# Patient Record
Sex: Female | Born: 1975 | Race: White | Hispanic: No | Marital: Married | Smoking: Never smoker
Health system: Southern US, Community
[De-identification: ages and names within clinical notes are randomized; demographics above are authoritative.]

---

## 2017-01-17 ENCOUNTER — Other Ambulatory Visit (HOSPITAL_COMMUNITY): Payer: Self-pay | Admitting: Internal Medicine

## 2017-01-17 DIAGNOSIS — R1030 Lower abdominal pain, unspecified: Secondary | ICD-10-CM

## 2017-01-17 DIAGNOSIS — R109 Unspecified abdominal pain: Secondary | ICD-10-CM

## 2017-01-23 ENCOUNTER — Ambulatory Visit (HOSPITAL_COMMUNITY)
Admission: RE | Admit: 2017-01-23 | Discharge: 2017-01-23 | Disposition: A | Discharge status: Home / Self Care | Payer: 59 | Source: Ambulatory Visit | Attending: Internal Medicine | Admitting: Internal Medicine

## 2017-01-30 ENCOUNTER — Ambulatory Visit (HOSPITAL_COMMUNITY)
Admission: RE | Admit: 2017-01-30 | Discharge: 2017-01-30 | Disposition: A | Discharge status: Home / Self Care | Payer: 59 | Source: Ambulatory Visit | Attending: Internal Medicine | Admitting: Internal Medicine

## 2017-01-30 ENCOUNTER — Encounter (HOSPITAL_COMMUNITY): Payer: Self-pay

## 2018-07-17 ENCOUNTER — Encounter (HOSPITAL_COMMUNITY): Payer: Self-pay | Admitting: Emergency Medicine

## 2018-07-17 ENCOUNTER — Emergency Department (HOSPITAL_COMMUNITY): Payer: 59

## 2018-07-17 ENCOUNTER — Emergency Department (HOSPITAL_COMMUNITY)
Admission: EM | Admit: 2018-07-17 | Discharge: 2018-07-17 | Disposition: A | Discharge status: Home / Self Care | Payer: 59 | Attending: Emergency Medicine | Admitting: Emergency Medicine

## 2018-07-17 DIAGNOSIS — Z79899 Other long term (current) drug therapy: Secondary | ICD-10-CM | POA: Insufficient documentation

## 2018-07-17 DIAGNOSIS — R1032 Left lower quadrant pain: Secondary | ICD-10-CM | POA: Insufficient documentation

## 2018-07-17 LAB — URINALYSIS, ROUTINE W REFLEX MICROSCOPIC
Bacteria, UA: NONE SEEN
Bilirubin Urine: NEGATIVE
Glucose, UA: NEGATIVE mg/dL
Ketones, ur: NEGATIVE mg/dL
Leukocytes, UA: NEGATIVE
Nitrite: NEGATIVE
Protein, ur: NEGATIVE mg/dL
Specific Gravity, Urine: 1.014 (ref 1.005–1.030)
pH: 5 (ref 5.0–8.0)

## 2018-07-17 LAB — COMPREHENSIVE METABOLIC PANEL WITH GFR
ALT: 19 U/L (ref 0–44)
AST: 22 U/L (ref 15–41)
Albumin: 3.8 g/dL (ref 3.5–5.0)
Alkaline Phosphatase: 52 U/L (ref 38–126)
Anion gap: 9 (ref 5–15)
BUN: 10 mg/dL (ref 6–20)
CO2: 24 mmol/L (ref 22–32)
Calcium: 9.2 mg/dL (ref 8.9–10.3)
Chloride: 108 mmol/L (ref 98–111)
Creatinine, Ser: 0.82 mg/dL (ref 0.44–1.00)
GFR calc Af Amer: >60 mL/min
GFR calc non Af Amer: >60 mL/min
Glucose, Bld: 98 mg/dL (ref 70–99)
Potassium: 5.2 mmol/L — ABNORMAL HIGH (ref 3.5–5.1)
Sodium: 141 mmol/L (ref 135–145)
Total Bilirubin: 0.8 mg/dL (ref 0.3–1.2)
Total Protein: 7 g/dL (ref 6.5–8.1)

## 2018-07-17 LAB — CBC
HEMATOCRIT: 40.3 % (ref 36.0–46.0)
HEMOGLOBIN: 13.2 g/dL (ref 12.0–15.0)
MCH: 31.5 pg (ref 26.0–34.0)
MCHC: 32.8 g/dL (ref 30.0–36.0)
MCV: 96.2 fL (ref 78.0–100.0)
Platelets: 268 10*3/uL (ref 150–400)
RBC: 4.19 MIL/uL (ref 3.87–5.11)
RDW: 11.9 % (ref 11.5–15.5)
WBC: 7.9 10*3/uL (ref 4.0–10.5)

## 2018-07-17 LAB — I-STAT BETA HCG BLOOD, ED (MC, WL, AP ONLY): I-stat hCG, quantitative: <5 m[IU]/mL

## 2018-07-17 LAB — LIPASE, BLOOD: Lipase: 31 U/L (ref 11–51)

## 2018-07-17 MED ORDER — ONDANSETRON 4 MG PO TBDP: 4.0000 mg | ORAL_TABLET | Freq: Three times a day (TID) | ORAL | 0 refills | Status: AC | PRN

## 2018-07-17 MED ORDER — ONDANSETRON 4 MG PO TBDP
4.0000 mg | ORAL_TABLET | Freq: Once | ORAL | Status: AC
  Administered 2018-07-17: 4 mg via ORAL
  Filled 2018-07-17: qty 1

## 2018-07-17 MED ORDER — OXYCODONE-ACETAMINOPHEN 5-325 MG PO TABS
1.0000 | ORAL_TABLET | ORAL | Status: DC | PRN
  Administered 2018-07-17: 1 via ORAL
  Filled 2018-07-17: qty 1

## 2018-07-17 MED ORDER — OXYCODONE-ACETAMINOPHEN 5-325 MG PO TABS: 2.0000 | ORAL_TABLET | Freq: Four times a day (QID) | ORAL | 0 refills | Status: AC | PRN

## 2018-07-17 NOTE — Discharge Instructions (Addendum)
Evaluated today for abdominal pain.  Ultrasound results did not show any evidence of ovarian torsion.  However as discussed during your visit ultrasounds do not show everything in the abdomen.  We had discussed a possible CT scan however you have wished to not proceed with that.    Please follow-up with your PCP or return to the ED for any of the following symptoms. Your pain does not go away as soon as your doctor says it should. You cannot stop throwing up. Your pain is only in areas of your belly, such as the right side or the left lower part of the belly. You have bloody or black poop, or poop that looks like tar. You have very bad pain, cramping, or bloating in your belly. You have signs of not having enough fluid or water in your body (dehydration), such as: Dark pee, very little pee, or no pee. Cracked lips. Dry mouth. Sunken eyes. Sleepiness. Weakness.

## 2018-07-17 NOTE — ED Triage Notes (Signed)
Pt to ER for evaluation of LLQ abdominal pain onset yesterday at 9:30 am without any radiation. States associated nausea, without vomiting. Reports went to PCP this morning and was sent here for rule out ovarian torsion. Pt in NAD at this moment. Pt reports urination hurts.

## 2018-07-17 NOTE — ED Provider Notes (Signed)
[facility] EMERGENCY DEPARTMENT Provider Note   CSN: [id number] Arrival date & time: 07/17/18  1113   History   Chief Complaint Chief Complaint  Patient presents with  . Abdominal Pain    HPI Colleen Davidson is a 42 y.o. female with no significant medical history who presents for evaluation of LLQ pain. Was seen at primary care and sent for evaluation of ovarian torsion. States her pain began yesterday and has been intermittent in nature. Rates her pain a 9/10. Denies radiation of pain. Pain worse with movement. Rest alleviates the pain. Admits to nausea. Denies emesis, fever, chills, chest pain, SOB, diarrhea, constipation, dysuria, vaginal discharge, hx of STI. Currently on cycle. Does not want pelvic exam.  HPI  History reviewed. No pertinent past medical history.  There are no active problems to display for this patient.   History reviewed. No pertinent surgical history.   OB History   None      Home Medications    Prior to Admission medications   Medication Sig Start Date End Date Taking? Authorizing Provider  albuterol (PROVENTIL HFA;VENTOLIN HFA) 108 (90 Base) MCG/ACT inhaler Inhale 2 puffs into the lungs every 6 (six) hours as needed for wheezing.   Yes [provider]  cetirizine (ZYRTEC) 10 MG tablet Take 10 mg by mouth daily. 06/07/18  Yes [provider]  montelukast (SINGULAIR) 10 MG tablet Take 10 mg by mouth daily. 06/11/18  Yes [provider]  SYMBICORT 160-4.5 MCG/ACT inhaler Inhale 2 puffs into the lungs 2 (two) times daily. 07/09/18  Yes [provider]  ondansetron (ZOFRAN ODT) 4 MG disintegrating tablet Take 1 tablet (4 mg total) by mouth every 8 (eight) hours as needed for nausea or vomiting. 07/17/18   Chrishawna Farina A, PA-C  oxyCODONE-acetaminophen (PERCOCET/ROXICET) 5-325 MG tablet Take 2 tablets by mouth every 6 (six) hours as needed for up to 3 days for severe pain. 07/17/18 07/20/18  Taela Charbonneau  A, PA-C    Family History History reviewed. No pertinent family history.  Social History Social History   Tobacco Use  . Smoking status: Never Smoker  . Smokeless tobacco: Never Used  Substance Use Topics  . Alcohol use: Not Currently  . Drug use: Not on file     Allergies   Morphine and related   Review of Systems Review of Systems  Review of symptoms negative unless otherwise stated in the HPI. Physical Exam Updated Vital Signs BP 111/70 (BP Location: Right Arm)   Pulse 64   Temp 98.5 F (36.9 C) (Oral)   Resp 18   LMP 07/14/2018   SpO2 100%   Physical Exam  Constitutional: She appears well-developed and well-nourished.  Non-toxic appearance. She does not appear ill. No distress.  HENT:  Head: Atraumatic.  Eyes: Pupils are equal, round, and reactive to light.  Neck: Normal range of motion. Neck supple.  Cardiovascular: Normal rate, regular rhythm, normal heart sounds and intact distal pulses.  Pulmonary/Chest: Effort normal. No respiratory distress.  Abdominal: Soft. Normal appearance, normal aorta and bowel sounds are normal. She exhibits no shifting dullness, no distension, no pulsatile liver, no fluid wave, no abdominal bruit, no ascites, no pulsatile midline mass and no mass. There is no hepatosplenomegaly. There is tenderness in the left lower quadrant. There is no rigidity, no rebound, no guarding, no CVA tenderness, no tenderness at McBurney's point and negative Murphy's sign. No hernia.  Musculoskeletal: Normal range of motion.  Neurological: She is alert.  Skin:  Skin is warm and dry. No rash noted. She is not diaphoretic. No cyanosis or erythema. No pallor.  Psychiatric: She has a normal mood and affect.  Nursing note and vitals reviewed.    ED Treatments / Results  Labs (all labs ordered are listed, but only abnormal results are displayed) Labs Reviewed  COMPREHENSIVE METABOLIC PANEL - Abnormal; Notable for the following components:      Result  Value   Potassium 5.2 (*)    All other components within normal limits  URINALYSIS, ROUTINE W REFLEX MICROSCOPIC - Abnormal; Notable for the following components:   Hgb urine dipstick LARGE (*)    All other components within normal limits  LIPASE, BLOOD  CBC  I-STAT BETA HCG BLOOD, ED ([facility], [facility], [facility] ONLY)    EKG None  Radiology US Transvaginal Non-ob  Result Date: 07/17/2018 CLINICAL DATA:  Left pelvic pain for 2 days. Clinical suspicion for ovarian torsion. EXAM: TRANSABDOMINAL AND TRANSVAGINAL ULTRASOUND OF PELVIS DOPPLER ULTRASOUND OF OVARIES TECHNIQUE: Both transabdominal and transvaginal ultrasound examinations of the pelvis were performed. Transabdominal technique was performed for global imaging of the pelvis including uterus, ovaries, adnexal regions, and pelvic cul-de-sac. It was necessary to proceed with endovaginal exam following the transabdominal exam to visualize the endometrium and ovaries. Color and duplex Doppler ultrasound was utilized to evaluate blood flow to the ovaries. COMPARISON:  None. FINDINGS: Uterus Measurements: 10.8 x 4.8 x 4.9 cm. No fibroids or other mass visualized. Endometrium Thickness: 8 mm.  No focal abnormality visualized. Right ovary Measurements: 3.9 x 2.0 x 2.1 cm. Normal appearance/no adnexal mass. Left ovary Measurements: 3.4 x 2.0 x 2.7 cm. Normal appearance/no adnexal mass. Pulsed Doppler evaluation of both ovaries demonstrates normal low-resistance arterial and venous waveforms. Other findings No abnormal free fluid. IMPRESSION: Negative. No pelvic mass or other significant abnormality identified. No sonographic evidence for ovarian torsion. Electronically Signed   By: Myles Rosenthal M.D.   On: 07/17/2018 13:06   US Pelvis Complete  Result Date: 07/17/2018 CLINICAL DATA:  Left pelvic pain for 2 days. Clinical suspicion for ovarian torsion. EXAM: TRANSABDOMINAL AND TRANSVAGINAL ULTRASOUND OF PELVIS DOPPLER ULTRASOUND OF OVARIES TECHNIQUE: Both transabdominal  and transvaginal ultrasound examinations of the pelvis were performed. Transabdominal technique was performed for global imaging of the pelvis including uterus, ovaries, adnexal regions, and pelvic cul-de-sac. It was necessary to proceed with endovaginal exam following the transabdominal exam to visualize the endometrium and ovaries. Color and duplex Doppler ultrasound was utilized to evaluate blood flow to the ovaries. COMPARISON:  None. FINDINGS: Uterus Measurements: 10.8 x 4.8 x 4.9 cm. No fibroids or other mass visualized. Endometrium Thickness: 8 mm.  No focal abnormality visualized. Right ovary Measurements: 3.9 x 2.0 x 2.1 cm. Normal appearance/no adnexal mass. Left ovary Measurements: 3.4 x 2.0 x 2.7 cm. Normal appearance/no adnexal mass. Pulsed Doppler evaluation of both ovaries demonstrates normal low-resistance arterial and venous waveforms. Other findings No abnormal free fluid. IMPRESSION: Negative. No pelvic mass or other significant abnormality identified. No sonographic evidence for ovarian torsion. Electronically Signed   By: Myles Rosenthal M.D.   On: 07/17/2018 13:06   [place] Art/ven Flow Abd Pelv Doppler  Result Date: 07/17/2018 CLINICAL DATA:  Left pelvic pain for 2 days. Clinical suspicion for ovarian torsion. EXAM: TRANSABDOMINAL AND TRANSVAGINAL ULTRASOUND OF PELVIS DOPPLER ULTRASOUND OF OVARIES TECHNIQUE: Both transabdominal and transvaginal ultrasound examinations of the pelvis were performed. Transabdominal technique was performed for global imaging of the pelvis including uterus, ovaries, adnexal regions, and pelvic  cul-de-sac. It was necessary to proceed with endovaginal exam following the transabdominal exam to visualize the endometrium and ovaries. Color and duplex Doppler ultrasound was utilized to evaluate blood flow to the ovaries. COMPARISON:  None. FINDINGS: Uterus Measurements: 10.8 x 4.8 x 4.9 cm. No fibroids or other mass visualized. Endometrium Thickness: 8 mm.  No focal  abnormality visualized. Right ovary Measurements: 3.9 x 2.0 x 2.1 cm. Normal appearance/no adnexal mass. Left ovary Measurements: 3.4 x 2.0 x 2.7 cm. Normal appearance/no adnexal mass. Pulsed Doppler evaluation of both ovaries demonstrates normal low-resistance arterial and venous waveforms. Other findings No abnormal free fluid. IMPRESSION: Negative. No pelvic mass or other significant abnormality identified. No sonographic evidence for ovarian torsion. Electronically Signed   By: Myles Rosenthal M.D.   On: 07/17/2018 13:06    Procedures Procedures (including critical care time)  Medications Ordered in ED Medications  oxyCODONE-acetaminophen (PERCOCET/ROXICET) 5-325 MG per tablet 1 tablet (1 tablet Oral Given 07/17/18 1151)  ondansetron (ZOFRAN-ODT) disintegrating tablet 4 mg (has no administration in time range)     Initial Impression / Assessment and Plan / ED Course  I have reviewed the triage vital signs and the nursing notes as well as past medical history.  Pertinent labs & imaging results that were available during my care of the patient were reviewed by me and considered in my medical decision making (see chart for details).  42 year old presents for evaluation of LLQ pain x 1 day. Seen at PCP and sent for r/o ovarian torsion. Patient is nontoxic, nonseptic appearing, in no apparent distress.  U/S negative for torsion. Serial abdominal exams benign. Patient's pain and other symptoms adequately managed in emergency department. Labs, imaging and vitals reviewed.  Patient does not meet the SIRS or Sepsis criteria.    On repeat exam patient does not have a surgical abdomin and there are no peritoneal signs.  No indication of appendicitis, bowel obstruction, bowel perforation, cholecystitis, diverticulitis, PID or ectopic pregnancy.   Discussed with patient possible CT scan to determine cause of her abdominal pain. Patient prefers to not have a CT scan at this time and to go home with pain  medication and follow up with PCP. Patient discharged home with symptomatic treatment and given strict instructions for follow-up with their primary care physician.  Her potassium is mildy elevated. Discussed to follow up with PCP for recheck. EKG: NSR, without  I have also discussed reasons to return immediately to the ER. Patient expresses understanding and agrees with plan.     Final Clinical Impressions(s) / ED Diagnoses   Final diagnoses:  Left lower quadrant pain    ED Discharge Orders         Ordered    oxyCODONE-acetaminophen (PERCOCET/ROXICET) 5-325 MG tablet  Every 6 hours PRN     07/17/18 1351    ondansetron (ZOFRAN ODT) 4 MG disintegrating tablet  Every 8 hours PRN     07/17/18 1402           Joyce Heitman A, PA-C 07/17/18 1405    Sabas Sous, MD 07/17/18 2251

## 2018-10-19 IMAGING — US US PELVIS COMPLETE
1 series · 13 of 25 positions shown · non-contrast
Comparison: None.

CLINICAL DATA: Left pelvic pain for 2 days. Clinical suspicion for
ovarian torsion.

EXAM:
TRANSABDOMINAL AND TRANSVAGINAL ULTRASOUND OF PELVIS
DOPPLER ULTRASOUND OF OVARIES
TECHNIQUE: Both transabdominal and transvaginal ultrasound examinations of the
pelvis were performed. Transabdominal technique was performed for
global imaging of the pelvis including uterus, ovaries, adnexal
regions, and pelvic cul-de-sac.
It was necessary to proceed with endovaginal exam following the
transabdominal exam to visualize the endometrium and ovaries. Color
and duplex Doppler ultrasound was utilized to evaluate blood flow to
the ovaries.

[Series 1: us pelvis complete · 0.21mm/px · 13 of 76 slices shown]
[im 1/76]
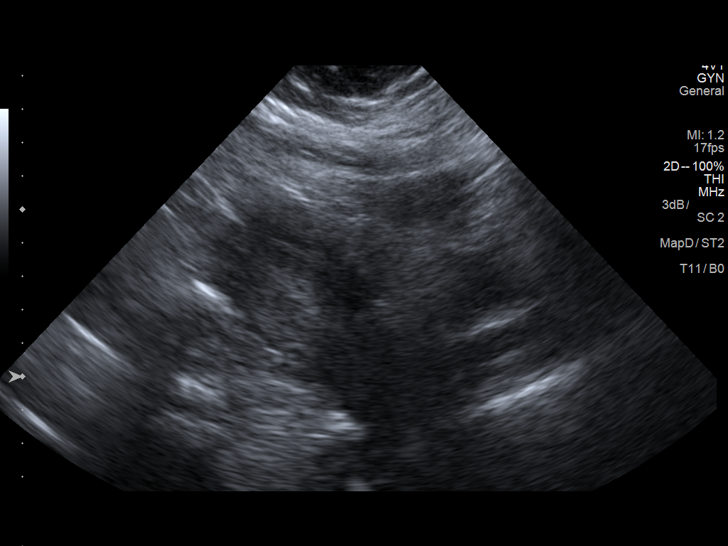
[im 7/76]
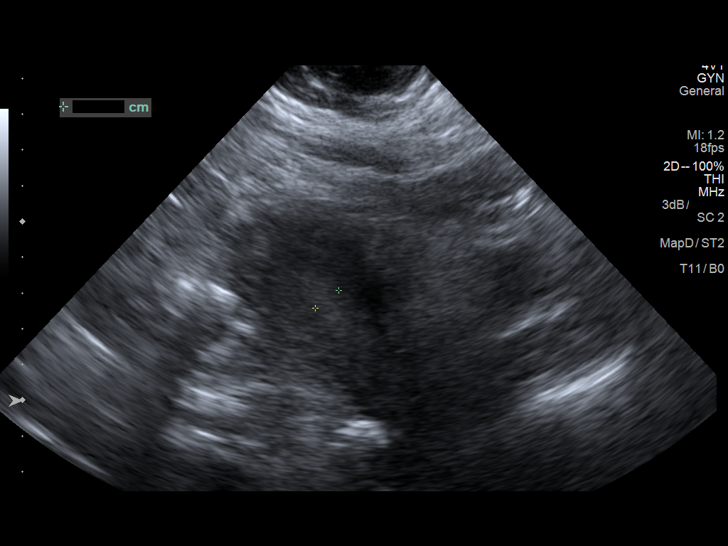
[im 13/76]
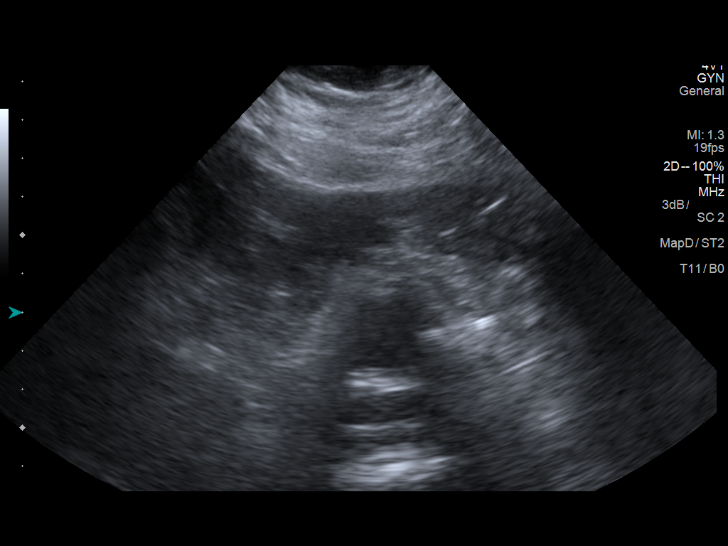
[im 19/76]
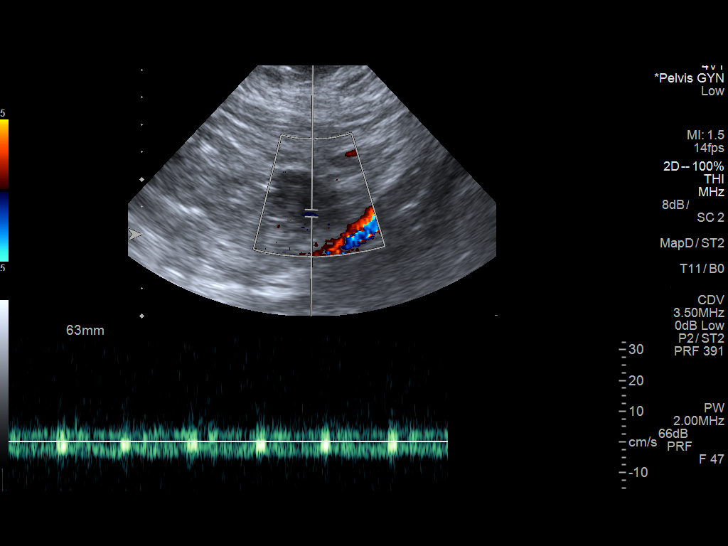
[im 26/76]
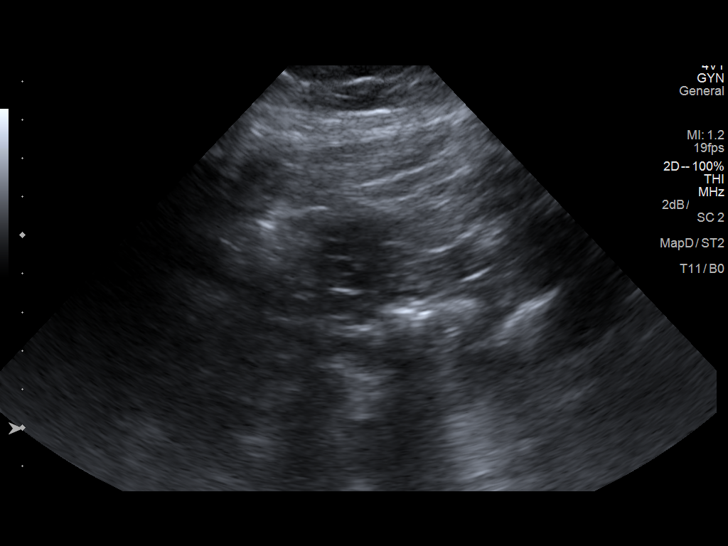
[im 32/76]
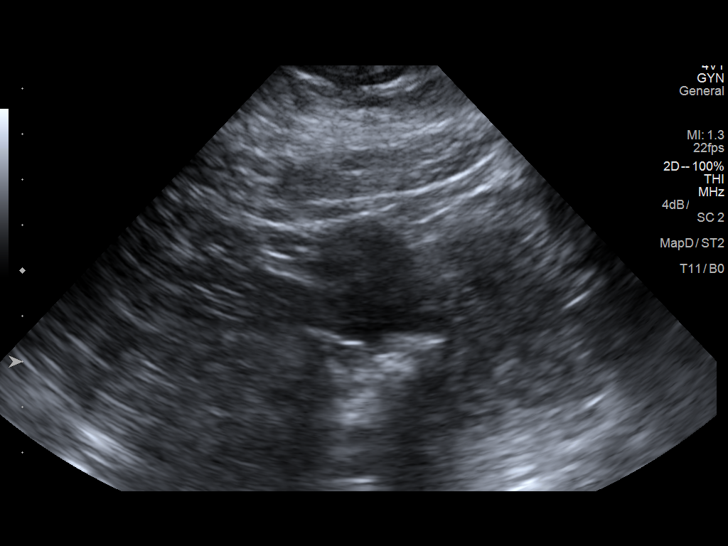
[im 38/76]
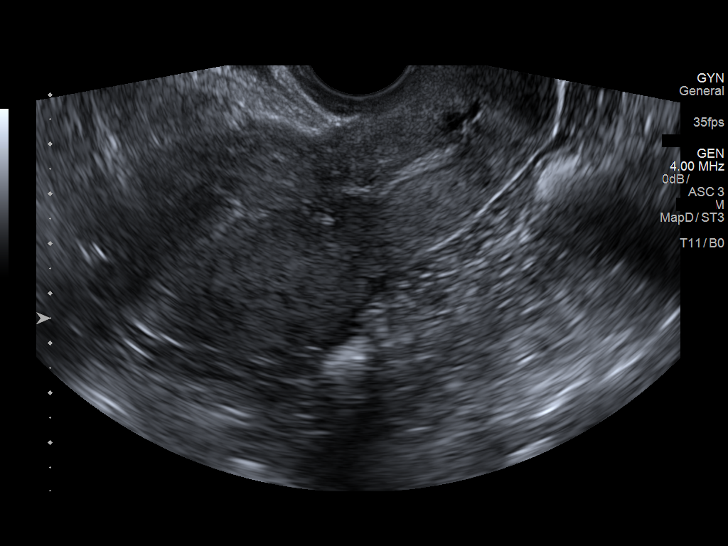
[im 44/76]
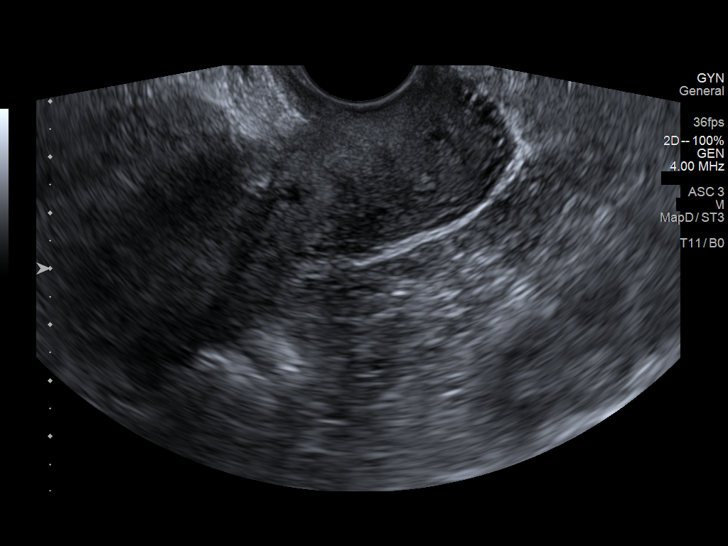
[im 51/76]
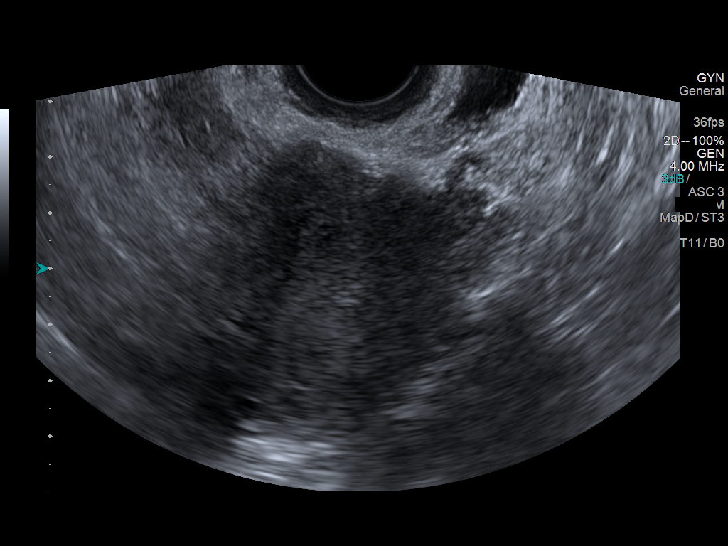
[im 57/76]
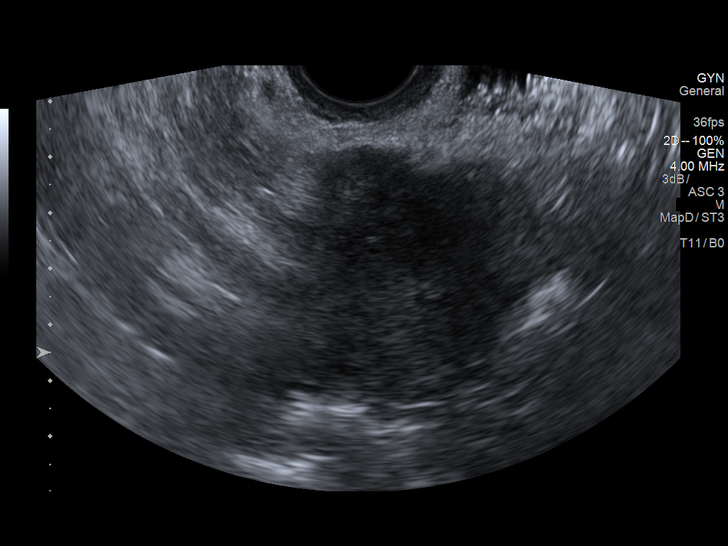
[im 63/76]
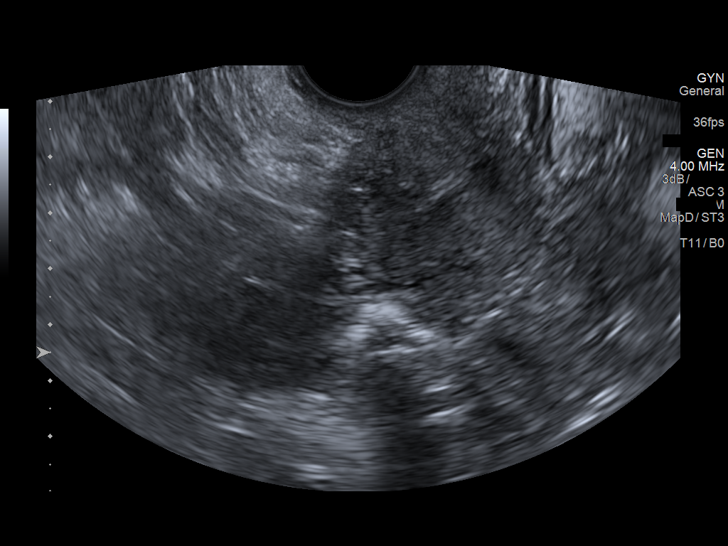
[im 69/76]
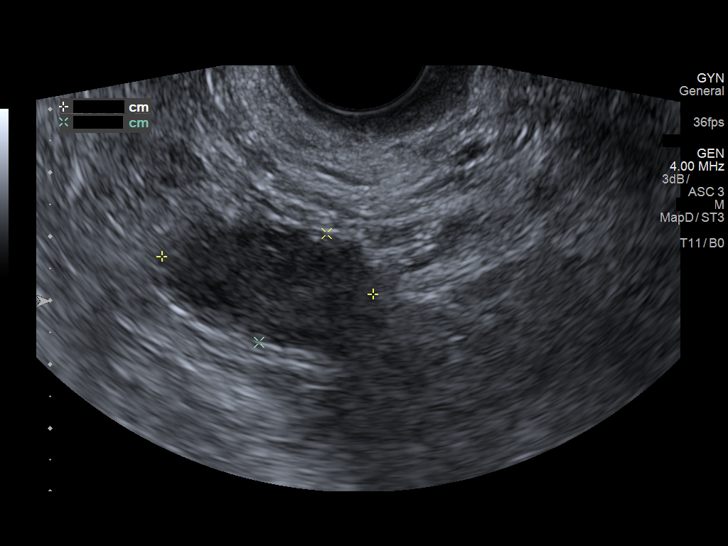
[im 76/76]
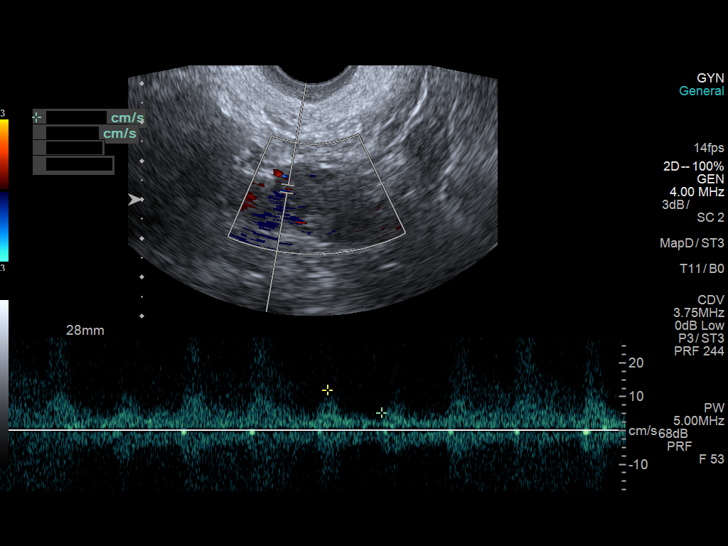

[13 of 25 positions shown; findings below may reference images not displayed]

FINDINGS: Uterus

Measurements: 10.8 x 4.8 x 4.9 cm. No fibroids or other mass
visualized.

Endometrium

Thickness: 8 mm.  No focal abnormality visualized.

Right ovary

Measurements: 3.9 x 2.0 x 2.1 cm. Normal appearance/no adnexal mass.

Left ovary

Measurements: 3.4 x 2.0 x 2.7 cm. Normal appearance/no adnexal mass.

Pulsed Doppler evaluation of both ovaries demonstrates normal
low-resistance arterial and venous waveforms.

Other findings

No abnormal free fluid.
IMPRESSION: Negative. No pelvic mass or other significant abnormality
identified.

No sonographic evidence for ovarian torsion.
